# Patient Record
Sex: Male | Born: 1995 | Marital: Single | Smoking: Never smoker
Health system: Southern US, Community
[De-identification: ages and names within clinical notes are randomized; demographics above are authoritative.]

## PROBLEM LIST (undated history)

## (undated) DIAGNOSIS — S3981XA Other specified injuries of abdomen, initial encounter: Secondary | ICD-10-CM

## (undated) HISTORY — PX: NO PAST SURGERIES: SHX2092

## (undated) HISTORY — DX: Other specified injuries of abdomen, initial encounter: S39.81XA

---

## 2016-12-31 ENCOUNTER — Ambulatory Visit: Payer: BLUE CROSS/BLUE SHIELD | Admitting: Family Medicine

## 2016-12-31 ENCOUNTER — Encounter: Payer: Self-pay | Admitting: Surgery

## 2016-12-31 ENCOUNTER — Ambulatory Visit (INDEPENDENT_AMBULATORY_CARE_PROVIDER_SITE_OTHER): Payer: BLUE CROSS/BLUE SHIELD | Admitting: Surgery

## 2016-12-31 VITALS — BP 146/74 | HR 61 | Temp 98.0°F | Wt 175.0 lb

## 2016-12-31 DIAGNOSIS — R1032 Left lower quadrant pain: Secondary | ICD-10-CM | POA: Diagnosis not present

## 2016-12-31 NOTE — Patient Instructions (Signed)
Please rest for two weeks and work yourself up to what you were use to.  Apply ice to the affected area several times a day.  Take Motrin 800 MG every 8 hours for pain.  Please give us a call in case you feel that it has not resolved.

## 2017-01-01 NOTE — Progress Notes (Signed)
  Outpatient Surgical Follow Up  01/01/2017  Aaron Howe is an 21 y.o. male.   Chief Complaint  Patient presents with  . New Patient (Initial Visit)    possible hernia    HPI: Aaron Howe is a 21 year old male seen in consultation at the request of Dr. Allena Katz ( case d/w provider)  Couple days ago patient reports that he was lifting weights and while the left refill significant pain on his left inguinal area. He did not do still feel a bulge but did develop significant sharp pain. Pain is intermittent and currently is improving. One of the providers from [facility] son and refer him for potential hernia. Agent denies any previous hernia operation abdominal operations in the past. He denies any bulging sensation. As I stated before his pain is improving slowly. No fevers no chills. He has great cardiovascular performance and is able to do more than 6 Mets without any shortness of breath or chest pain.Marland Kitchen   No past medical history on file.  Past Surgical History:  Procedure Laterality Date  . NO PAST SURGERIES      Family History  Problem Relation Age of Onset  . Skin cancer Mother     Social History:  reports that he has never smoked. He has never used smokeless tobacco. He reports that he does not drink alcohol or use drugs.  Allergies: No Known Allergies  Medications reviewed.    ROS Full ROS performed and is otherwise negative other than what is stated in HPI   BP (!) 146/74   Pulse 61   Temp 98 F (36.7 C) (Oral)   Wt 79.4 kg (175 lb)   Physical Exam  Constitutional: He is oriented to person, place, and time and well-developed, well-nourished, and in no distress. No distress.  Eyes: Right eye exhibits no discharge. Left eye exhibits no discharge. No scleral icterus.  Neck: No JVD present. No tracheal deviation present.  Cardiovascular: Normal rate, regular rhythm and normal heart sounds.  Exam reveals no friction rub.   No murmur heard. Pulmonary/Chest: Effort normal  and breath sounds normal. No stridor. No respiratory distress. He has no wheezes. He exhibits no tenderness.  Abdominal: He exhibits no distension. There is no tenderness. There is no rebound and no guarding.  Genitourinary: Penis normal.  Genitourinary Comments: Mild testicular tenderness, no masses, no evidence of inguinal hernia or masses  Musculoskeletal: Normal range of motion. He exhibits no edema or tenderness.  Neurological: He is alert and oriented to person, place, and time. Gait normal. GCS score is 15.  Skin: Skin is warm and dry. No rash noted. He is not diaphoretic. No erythema.  Psychiatric: Mood, memory, affect and judgment normal.  Nursing note and vitals reviewed.    Assessment/Plan: Muskuloskeletal injury from weight lifting. No evidence of a hernia defect at this time. Discussed with the patient in detail about my thought processes about my findings. Another possibility would be as sports hernia but I will still manage it conservatively chiefly with eyes, anti-inflammatory and rest. Discussed with the patient about measurements to relieve his symptoms as his stated before. No need for any surgical intervention we can certainly see him back in a few weeks if his symptoms persist. I do not think any further diagnostic testing is warranted at this time. Copy of this report sent to Dr. Allena Katz.   Sterling Big, MD [facility] General Surgeon

## 2017-01-21 ENCOUNTER — Other Ambulatory Visit: Payer: Self-pay | Admitting: Family Medicine

## 2017-01-21 ENCOUNTER — Ambulatory Visit
Admission: RE | Admit: 2017-01-21 | Discharge: 2017-01-21 | Disposition: A | Discharge status: Home / Self Care | Payer: BLUE CROSS/BLUE SHIELD | Source: Ambulatory Visit | Attending: Family Medicine | Admitting: Family Medicine

## 2017-01-21 ENCOUNTER — Ambulatory Visit (INDEPENDENT_AMBULATORY_CARE_PROVIDER_SITE_OTHER): Payer: BLUE CROSS/BLUE SHIELD | Admitting: Family Medicine

## 2017-01-21 DIAGNOSIS — R1032 Left lower quadrant pain: Secondary | ICD-10-CM

## 2017-01-21 MED ORDER — NAPROXEN 500 MG PO TABS: 500.0000 mg | ORAL_TABLET | Freq: Two times a day (BID) | ORAL | 0 refills | Status: AC

## 2017-01-21 NOTE — Progress Notes (Signed)
Patient presents with persistent symptoms of left groin pain radiating at times to the left testicle. Denies feeling or seeing any bulging in the area. Started to have symptoms a few weeks ago after a lift. Was evaluated by General Surgery who did not feel he had a true hernia but told him to take two weeks off of activity to see if he improved or symptoms got worse. Patient states that during the rest time his symptoms did improve some but did not fully go away. He started to run a few days ago and a modified lift and the symptoms got worse again. Denies pain with laughing, coughing, straining with bm. He does notice it some with yelling. No urinary symptoms.  ROS: Negative except mentioned above. Vitals as per Epic.   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

## 2017-01-25 ENCOUNTER — Other Ambulatory Visit: Payer: Self-pay | Admitting: Family Medicine

## 2017-01-25 DIAGNOSIS — M899 Disorder of bone, unspecified: Secondary | ICD-10-CM

## 2017-01-27 ENCOUNTER — Ambulatory Visit
Admission: RE | Admit: 2017-01-27 | Discharge: 2017-01-27 | Disposition: A | Discharge status: Home / Self Care | Payer: BLUE CROSS/BLUE SHIELD | Source: Ambulatory Visit | Attending: Family Medicine | Admitting: Family Medicine

## 2017-01-27 DIAGNOSIS — R188 Other ascites: Secondary | ICD-10-CM | POA: Diagnosis not present

## 2017-01-27 DIAGNOSIS — M899 Disorder of bone, unspecified: Secondary | ICD-10-CM | POA: Insufficient documentation

## 2017-01-27 DIAGNOSIS — R1032 Left lower quadrant pain: Secondary | ICD-10-CM | POA: Diagnosis not present

## 2017-02-01 ENCOUNTER — Telehealth: Payer: Self-pay | Admitting: General Practice

## 2017-02-01 NOTE — Telephone Encounter (Signed)
Patient called and left a message about making an appointment. Unable to leave a message on the patients voicemail due to the inbox was full, if the patient calls back please schedule appointment for this patient.

## 2017-02-03 NOTE — Telephone Encounter (Signed)
Patients coming in tomorrow. °

## 2017-02-04 ENCOUNTER — Encounter: Payer: Self-pay | Admitting: Surgery

## 2017-02-04 ENCOUNTER — Ambulatory Visit (INDEPENDENT_AMBULATORY_CARE_PROVIDER_SITE_OTHER): Payer: BLUE CROSS/BLUE SHIELD | Admitting: Surgery

## 2017-02-04 VITALS — BP 144/80 | HR 72 | Temp 98.1°F | Wt 173.0 lb

## 2017-02-04 DIAGNOSIS — S3981XA Other specified injuries of abdomen, initial encounter: Secondary | ICD-10-CM

## 2017-02-04 NOTE — Progress Notes (Signed)
Outpatient Surgical Follow Up  02/04/2017  Aaron Howe is an 21 y.o. male.   Chief Complaint  Patient presents with  . New Patient (Initial Visit)    possible hernia    HPI:   F/u Right groin pain c/w sports hernia. He reports she has rested and try anti-inflammatory but his symptoms persist. Specifically he complains about a intermittent left groin pain that radiates to the inner thigh in the scrotum. Gets worse with abduction of his thigh. MRI personal review there is  No evidence of tears,  Athletic pubalgia.    No past medical history on file.  Past Surgical History:  Procedure Laterality Date  . NO PAST SURGERIES      Family History  Problem Relation Age of Onset  . Skin cancer Mother     Social History:  reports that he has never smoked. He has never used smokeless tobacco. He reports that he does not drink alcohol or use drugs.  Allergies: No Known Allergies  Medications reviewed.    ROS Full ROS performed and is otherwise negative other than what is stated in HPI   BP (!) 144/80   Pulse 72   Temp 98.1 F (36.7 C) (Oral)   Wt 78.5 kg (173 lb)   Physical Exam  Constitutional: He is oriented to person, place, and time and well-developed, well-nourished, and in no distress. No distress.  Abdominal: Soft. He exhibits no distension and no mass. There is no rebound and no guarding.  Tenderness to palpation within the left  inguinal canal along the the ilioinguinal nerve  Neurological: He is alert and oriented to person, place, and time. Gait normal. GCS score is 15.  Skin: Skin is warm and dry. He is not diaphoretic.  Psychiatric: Mood, memory, affect and judgment normal.      Assessment/Plan:  1. Sports hernia, initial encounter DiscCussed with the patient in detail and he wishes to proceed with the ilioinguinal nerve block. Cousin with the patient and the family in detail about his disease process. They understand is a complex issues that may require  rest, anti-inflammatories and ilioinguinal nerve block. I will reevaluate in 2 weeks and if symptoms are not any better we will likely proceed with laparoscopic inguinal hernia repair with mesh. Scars with the patient and the family in detail again at length about his disease process. They understand and they do feel comfortable with my plan  I spent 40 minutes in this encounter with greater than 50% spent in counseling and coordination of care with the patient and the family  Procedure:  Ilioinguinal nerve block Left   Anesthesia; lidocaine 1% with epinephrine and plain bupivacaine + 40 mg Kenalog  Findings: complete resolution of pain  After informed consent was obtained with perform an ilioinguinal nerve block in the standard fashion using standard landmarks. Spectacular results with resolution of pain. Locations      Aaron Big, MD [facility] General Surgeon

## 2017-02-04 NOTE — Patient Instructions (Signed)
We will see you back in two weeks to make sure that you are feeling better.

## 2017-02-17 DIAGNOSIS — S3981XA Other specified injuries of abdomen, initial encounter: Secondary | ICD-10-CM | POA: Insufficient documentation

## 2017-02-18 ENCOUNTER — Ambulatory Visit: Payer: Self-pay | Admitting: Surgery

## 2018-03-10 ENCOUNTER — Encounter: Payer: Self-pay | Admitting: Family Medicine

## 2018-03-10 ENCOUNTER — Ambulatory Visit
Admission: RE | Admit: 2018-03-10 | Discharge: 2018-03-10 | Disposition: A | Discharge status: Home / Self Care | Payer: BLUE CROSS/BLUE SHIELD | Source: Ambulatory Visit | Attending: Family Medicine | Admitting: Family Medicine

## 2018-03-10 ENCOUNTER — Ambulatory Visit (INDEPENDENT_AMBULATORY_CARE_PROVIDER_SITE_OTHER): Payer: BLUE CROSS/BLUE SHIELD | Admitting: Family Medicine

## 2018-03-10 VITALS — BP 111/72 | HR 74 | Temp 98.2°F | Resp 14

## 2018-03-10 DIAGNOSIS — M79604 Pain in right leg: Secondary | ICD-10-CM

## 2018-03-10 DIAGNOSIS — M549 Dorsalgia, unspecified: Secondary | ICD-10-CM

## 2018-03-10 DIAGNOSIS — M545 Low back pain: Secondary | ICD-10-CM | POA: Diagnosis not present

## 2018-03-11 NOTE — Progress Notes (Signed)
Patient presents today with symptoms of right lower back pain.  Patient states that his symptoms started few days ago after football practice.  He denies any particular injury or trauma during practice.  He denies any history of back problems in the past.  He has experienced some pain going down into his right leg to his right calf.  He denies any incontinence, fever, weight loss.  He states that flexion and walking increase his pain.  He has been taking NSAIDs intermittently for his symptoms since they started.  He denies any problems sleeping.  ROS: Negative except mentioned above. Vitals as per Epic. GENERAL: NAD RESP: CTA B CARD: RRR MSK: No midline tenderness, tenderness localized to right SI area, full range of motion however does have pain with full flexion and also extension, positive straight leg raise, 5 out of 5 strength of lower extremities, good hamstring flexibility, slight antalgic gait but patient states normal for him NEURO: CN II-XII grossly intact   A/P: Right lower back pain with right radicular symptoms -will start patient on tapered dose of steroid, can take Tylenol if needed for pain, encouraged working on back rehab with core strengthening and hamstring flexibility with trainer/PT, will do x-ray of LS-spine, consider doing MRI next week if symptoms persistent, seek medical attention if any worsening symptoms, no football activity for now, above was discussed with his trainer.

## 2019-03-07 IMAGING — MR MR PELVIS W/O CM
4 of 7 series · 29 of 48 positions shown · non-contrast
Comparison: Single-view of the pelvis 01/21/2017.

CLINICAL DATA: Left groin pain since working [DATE] weeks ago.

EXAM:
MRI PELVIS WITHOUT CONTRAST
TECHNIQUE: Multiplanar multisequence MR imaging of the pelvis was performed. No
intravenous contrast was administered.

[Series 2: STIR · coronal · 4.0mm · 0.70mm/px · 8 of 45 slices shown (1 of 2)]
[im 1/45]
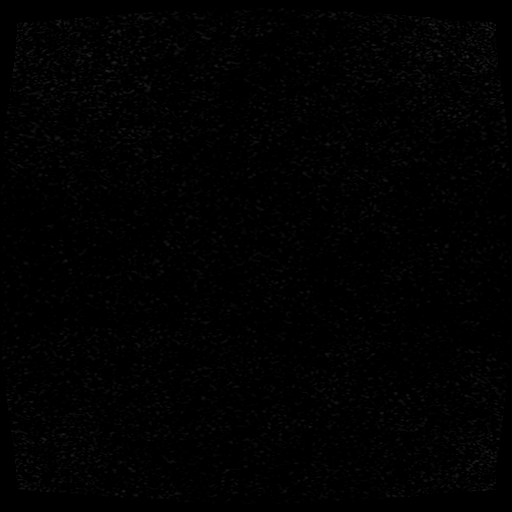
[im 7/45]
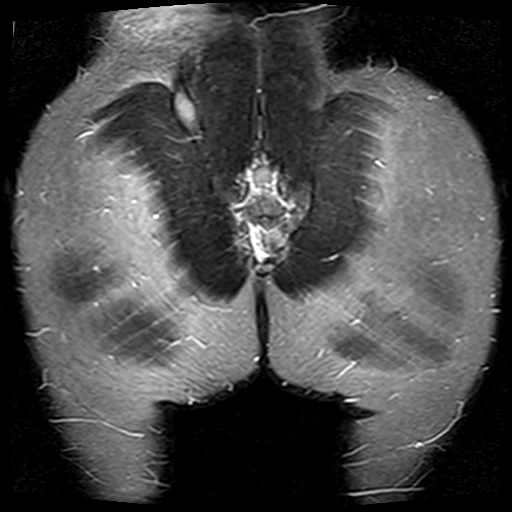
[im 13/45]
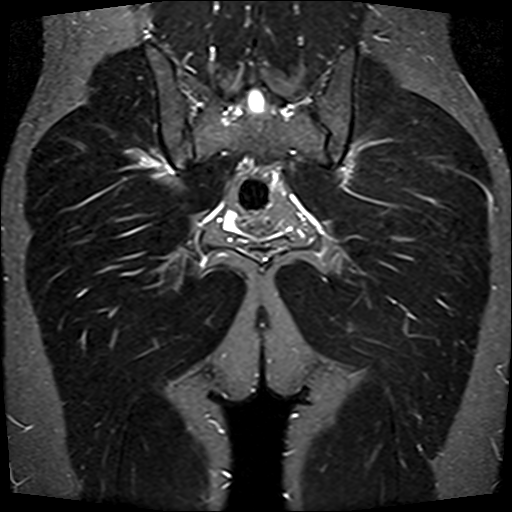
[im 19/45]
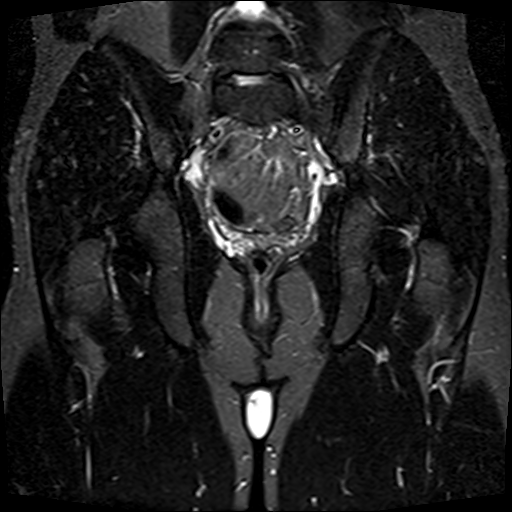
[im 26/45]
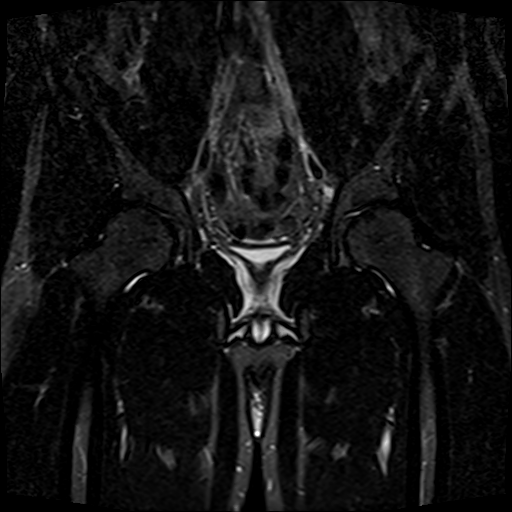
[im 32/45]
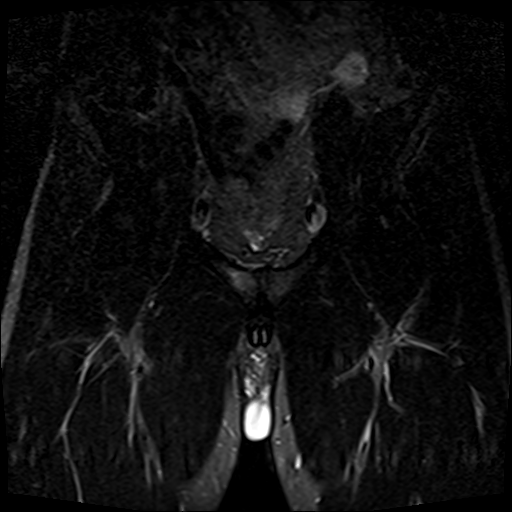
[im 38/45]
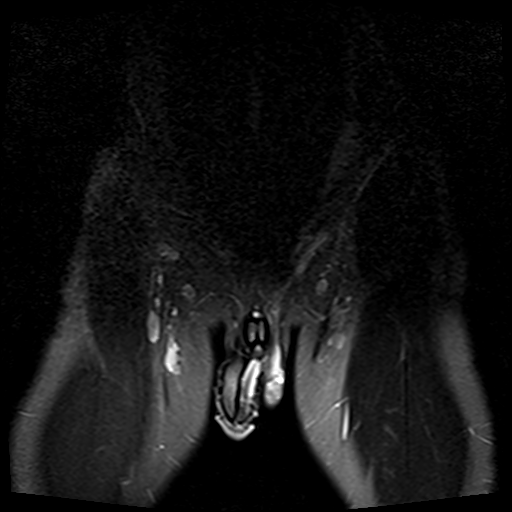
[im 45/45]
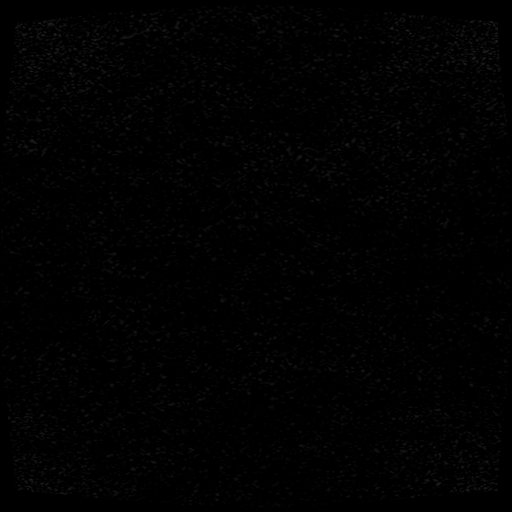

[Series 3: T1 · coronal · 4.0mm · 1.41mm/px · 7 of 45 slices shown]
[im 1/45]
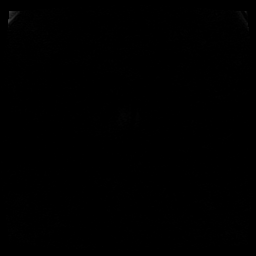
[im 8/45]
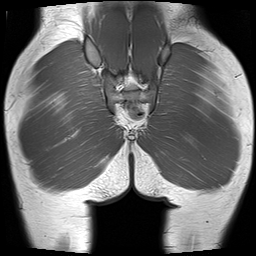
[im 15/45]
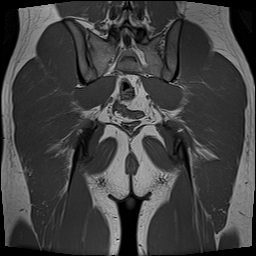
[im 23/45]
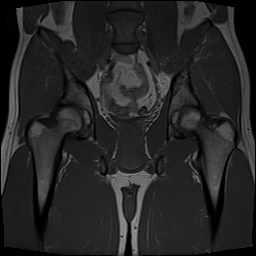
[im 30/45]
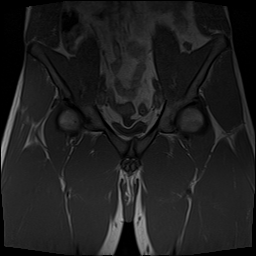
[im 37/45]
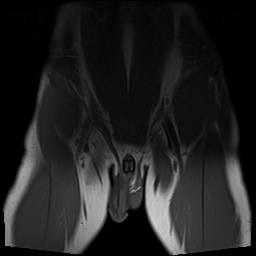
[im 45/45]
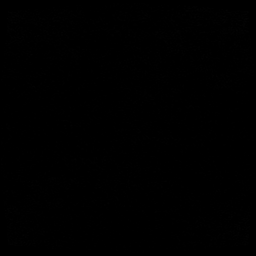

[Series 4: T2 fat-sat · axial · 4.0mm · 1.09mm/px · z∈[-81,+107]mm · 7 of 40 slices shown]
[im 1/40]
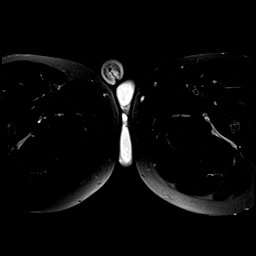
[im 7/40]
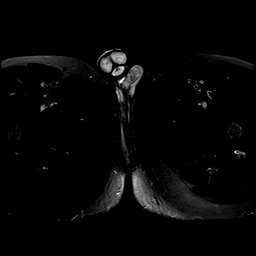
[im 14/40]
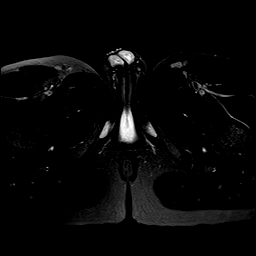
[im 20/40]
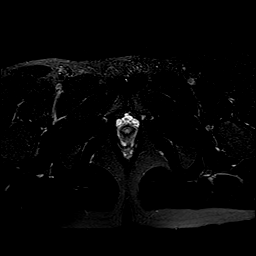
[im 27/40]
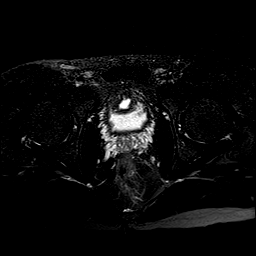
[im 33/40]
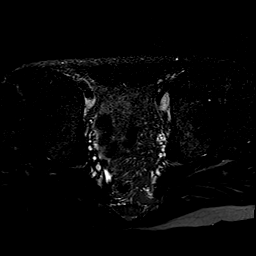
[im 40/40]
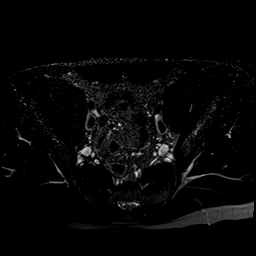

[Series 5: STIR · sagittal · 4.0mm · 0.43mm/px · 7 of 45 slices shown (2 of 2)]
[im 1/45]
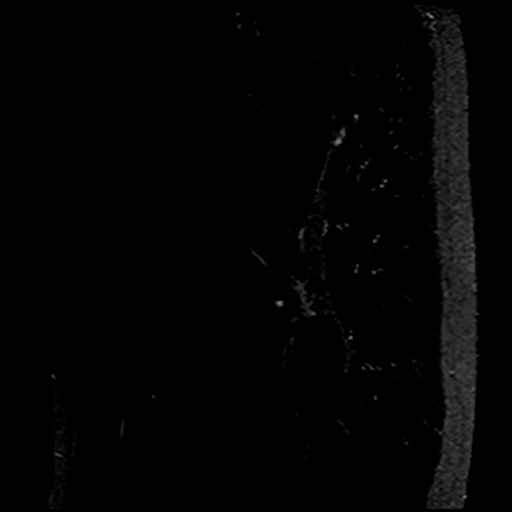
[im 8/45]
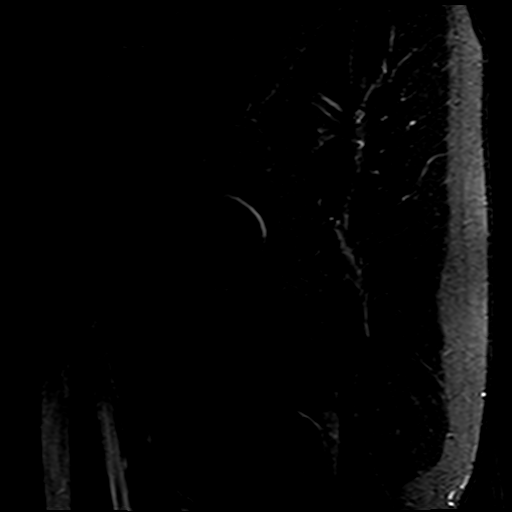
[im 15/45]
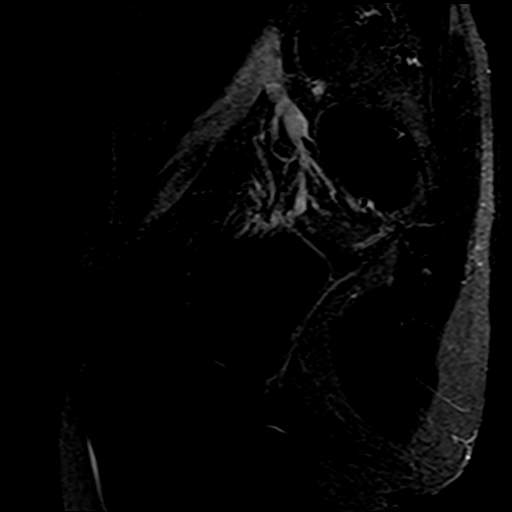
[im 23/45]
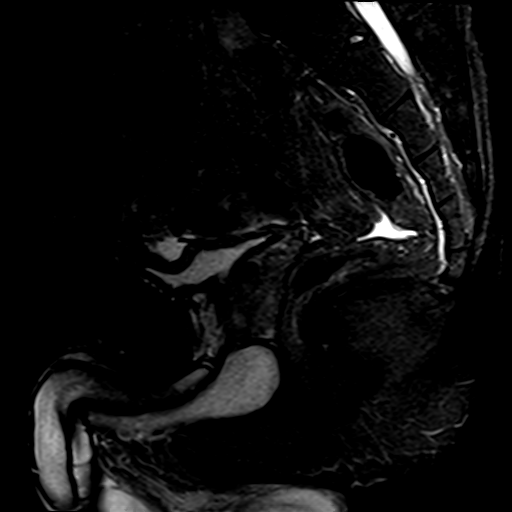
[im 30/45]
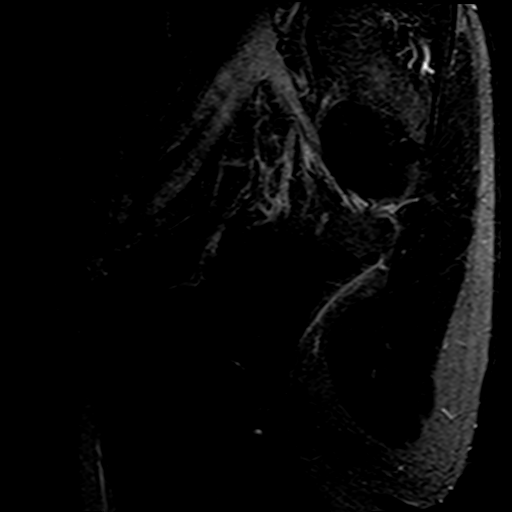
[im 37/45]
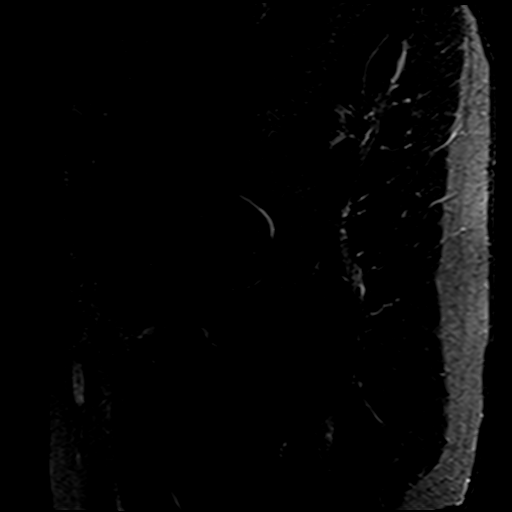
[im 45/45]
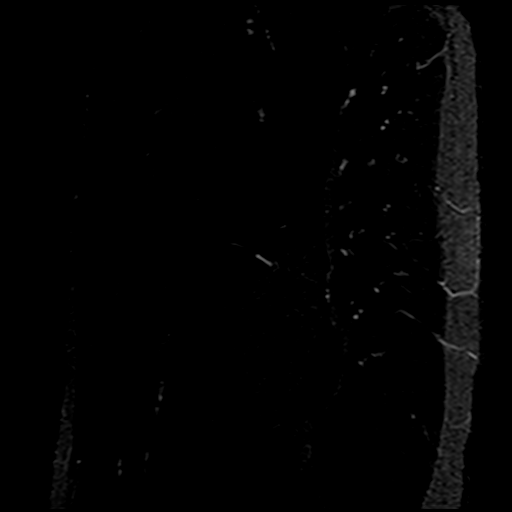

[29 of 48 positions shown; findings below may reference images not displayed]

FINDINGS: Minimal marrow edema about the symphysis pubis is more notable on
the right. There is mild spurring about the symphysis. Imaged bones
otherwise appear normal. The common adductor - rectus abdominis
aponeurosis and adductor longus tendon origins are intact and normal
in appearance. Remainder of musculature about the pelvis is normal.
There is no evidence of bursitis. No joint effusion. Sacroiliac
joints appear normal. Imaged intrapelvic contents demonstrate a
small volume of free pelvic fluid.
IMPRESSION: Findings consistent with very mild athletic pubalgia without tendon
tear.

Small volume of free pelvic fluid is considered abnormal in a male.
Question gastroenteritis. Cause for the fluid is not seen.

## 2020-04-17 IMAGING — CR DG LUMBAR SPINE COMPLETE 4+V
1 series · 5 of 5 positions shown · non-contrast
Comparison: Pelvis film of 01/21/2017

CLINICAL DATA: Right low back pain radiating down the right leg for
several days

EXAM:
LUMBAR SPINE - COMPLETE 4+ VIEW

[Series 1: dg lumbar spine complete 4 +v · 0.14mm/px · 5 of 5 slices shown]
[im 1/5]
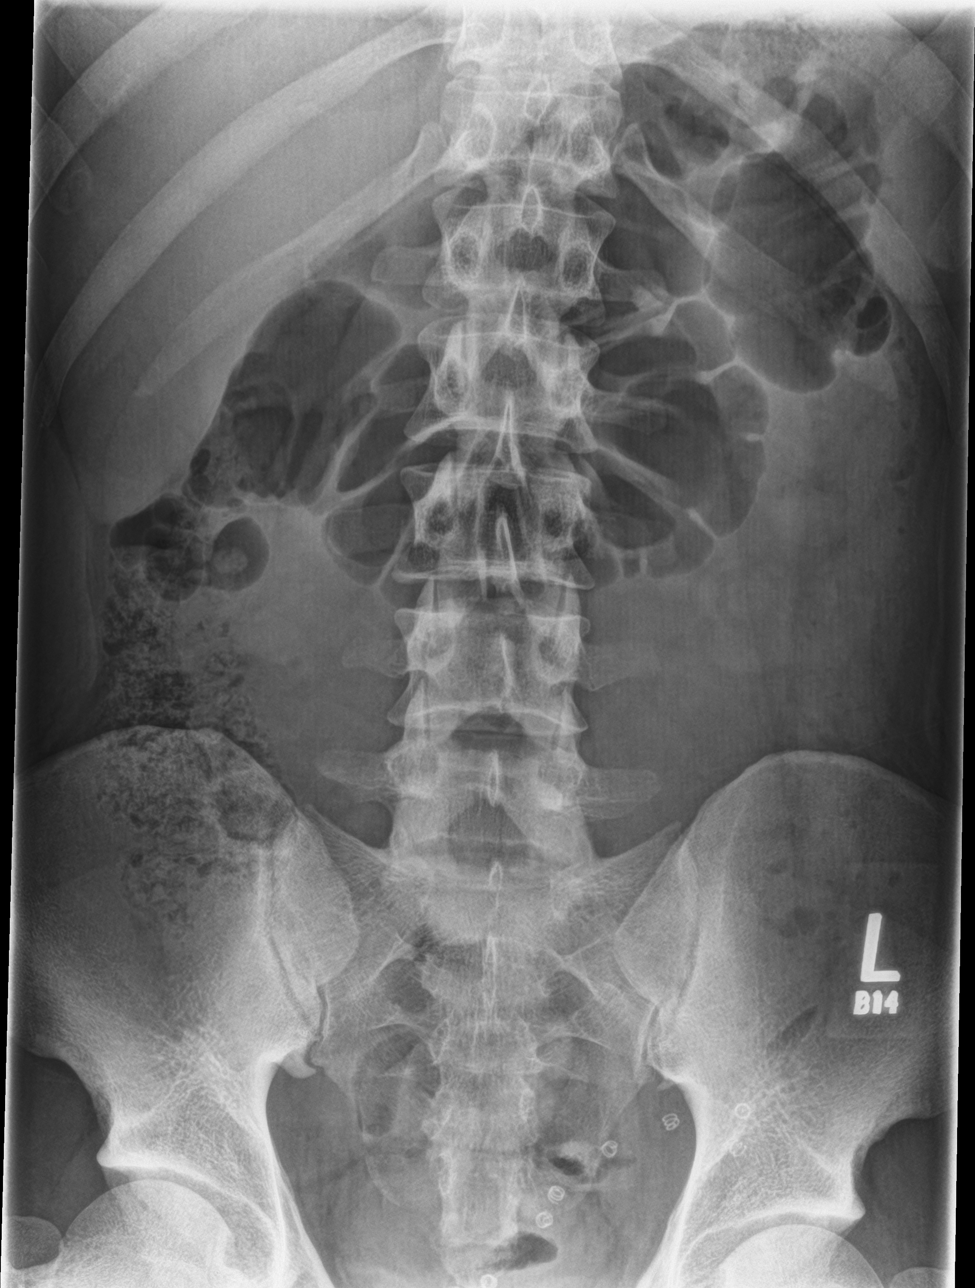
[im 2/5]
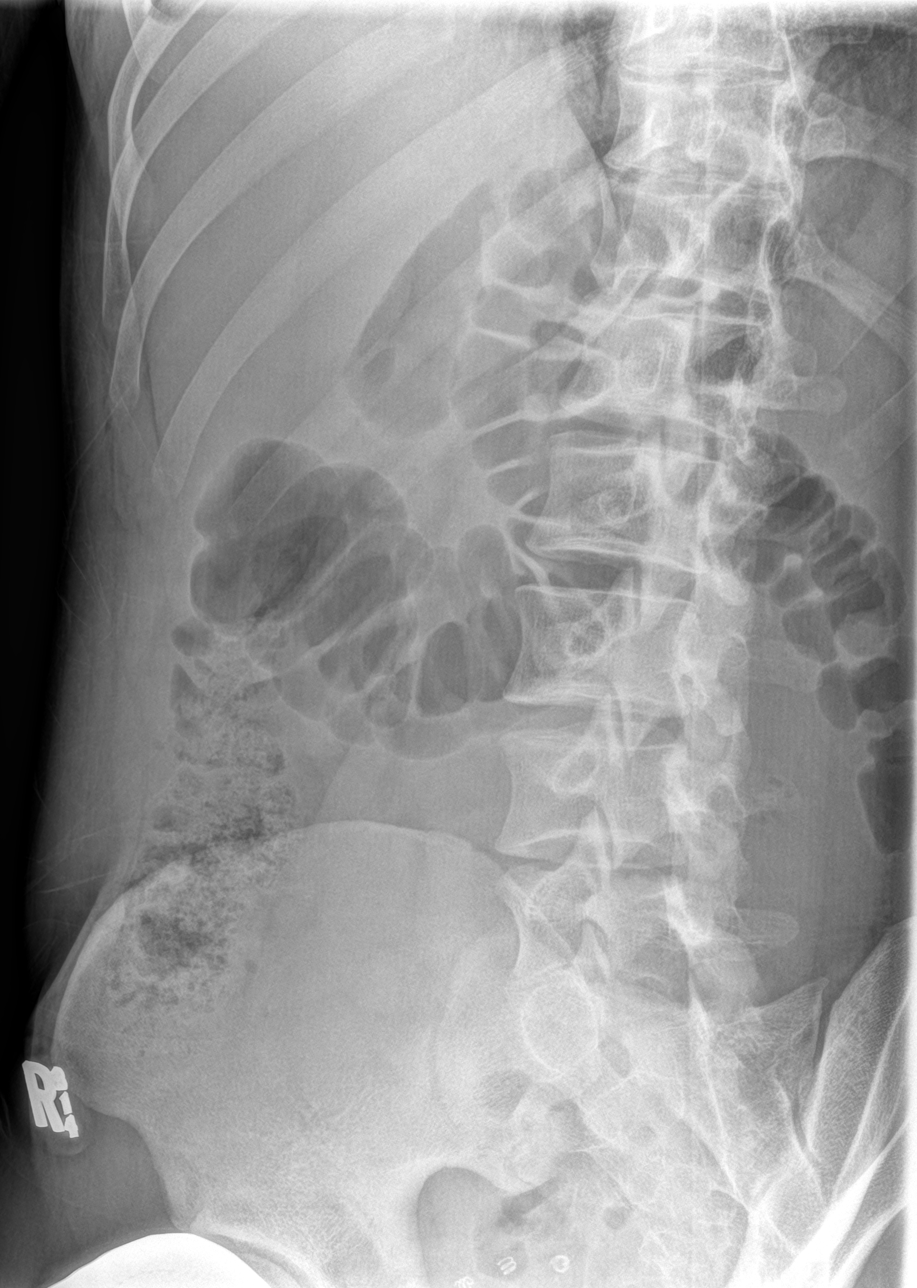
[im 3/5]
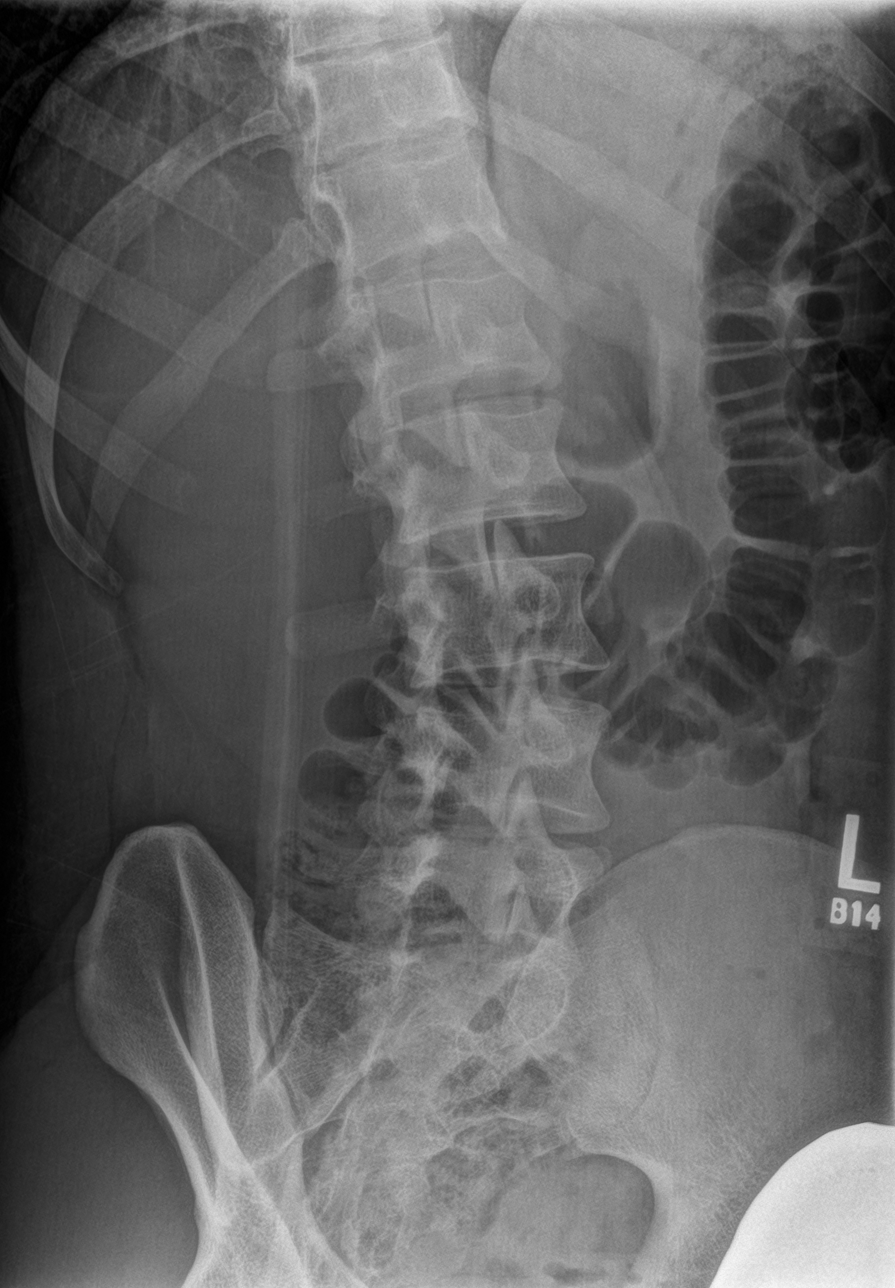
[im 4/5]
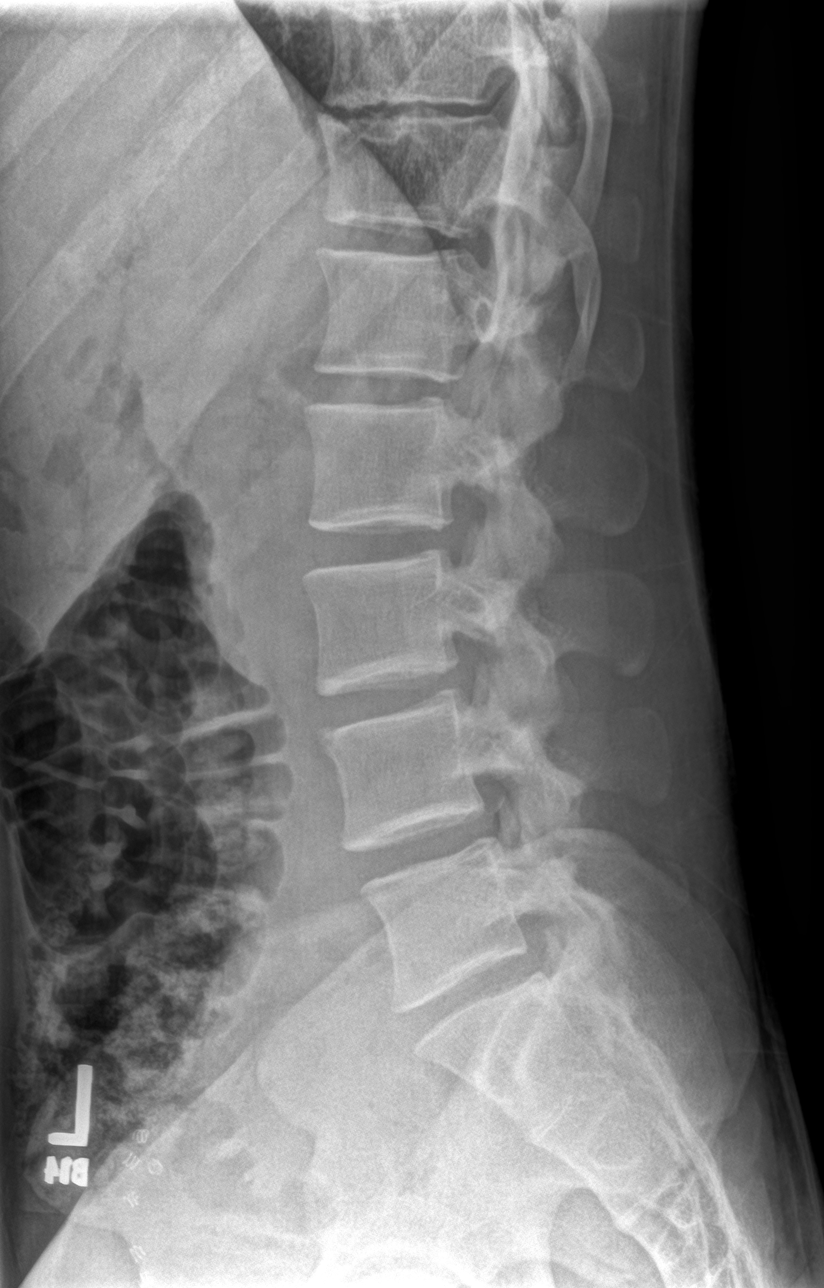
[im 5/5]
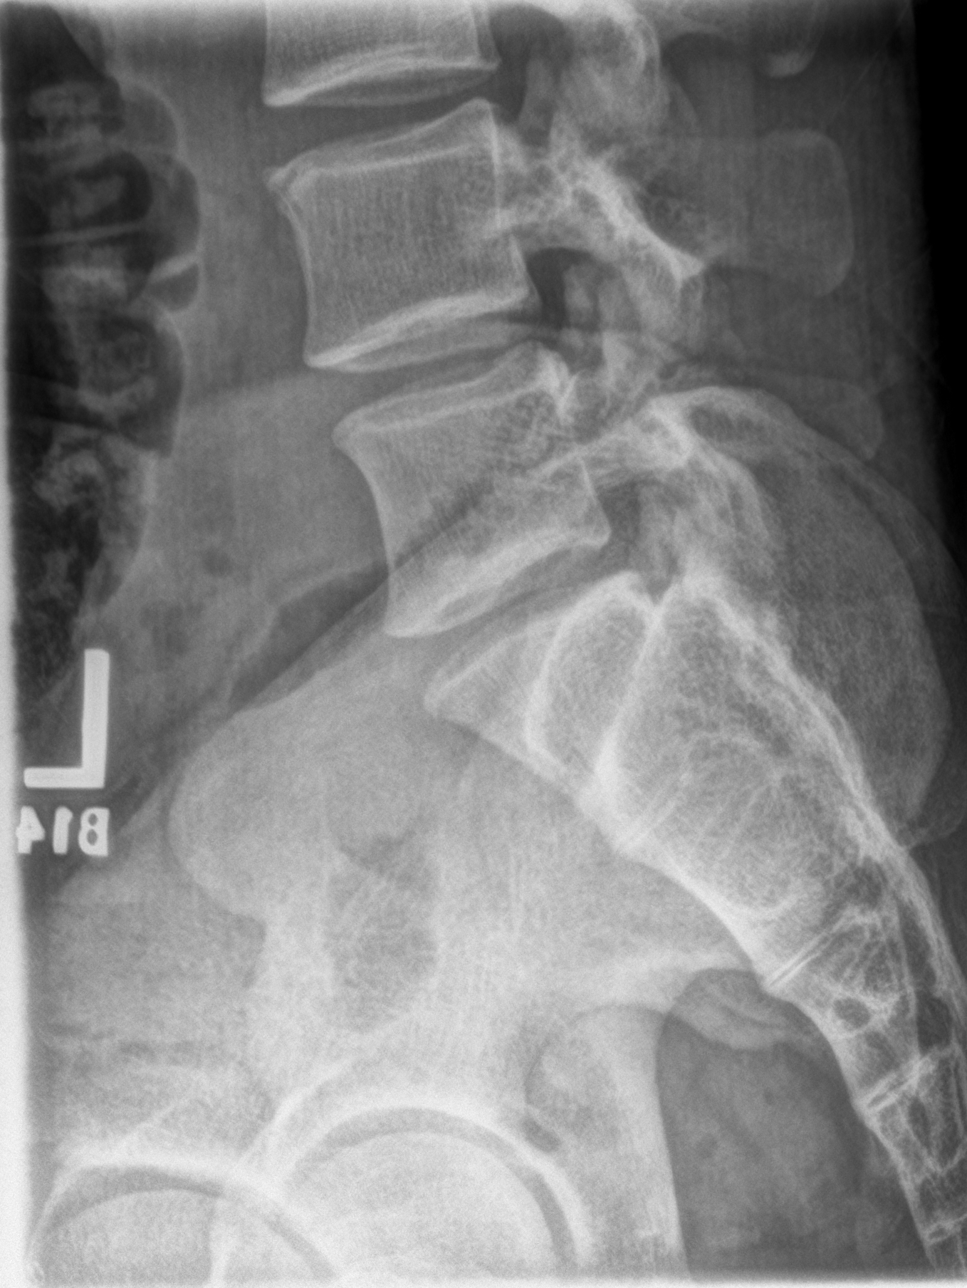

[5 of 5 positions shown; findings below may reference images not displayed]

FINDINGS: The lumbar vertebrae are in normal alignment. Intervertebral disc
spaces appear normal. No compression deformity is seen. No pars
defect is noted. The SI joints appear normally corticated.
IMPRESSION: Negative.
